# Patient Record
Sex: Male | Born: 1955 | Hispanic: No | Marital: Married | Smoking: Never smoker
Health system: Southern US, Community
[De-identification: ages and names within clinical notes are randomized; demographics above are authoritative.]

## PROBLEM LIST (undated history)

## (undated) DIAGNOSIS — E785 Hyperlipidemia, unspecified: Secondary | ICD-10-CM

## (undated) HISTORY — DX: Hyperlipidemia, unspecified: E78.5

## (undated) HISTORY — PX: SHOULDER SURGERY: SHX246

---

## 2009-02-14 ENCOUNTER — Encounter: Admission: RE | Admit: 2009-02-14 | Discharge: 2009-02-14 | Payer: Self-pay | Admitting: Chiropractic Medicine

## 2009-07-12 ENCOUNTER — Ambulatory Visit (HOSPITAL_BASED_OUTPATIENT_CLINIC_OR_DEPARTMENT_OTHER): Admission: RE | Admit: 2009-07-12 | Discharge: 2009-07-12 | Payer: Self-pay | Admitting: Orthopedic Surgery

## 2010-07-08 LAB — POCT HEMOGLOBIN-HEMACUE: Hemoglobin: 15.6 g/dL (ref 13.0–17.0)

## 2016-02-07 ENCOUNTER — Other Ambulatory Visit: Payer: Self-pay | Admitting: Orthopedic Surgery

## 2016-02-07 DIAGNOSIS — M5416 Radiculopathy, lumbar region: Secondary | ICD-10-CM

## 2016-02-21 ENCOUNTER — Ambulatory Visit
Admission: RE | Admit: 2016-02-21 | Discharge: 2016-02-21 | Disposition: A | Discharge status: Home / Self Care | Payer: 59 | Source: Ambulatory Visit | Attending: Orthopedic Surgery | Admitting: Orthopedic Surgery

## 2016-02-21 DIAGNOSIS — M5416 Radiculopathy, lumbar region: Secondary | ICD-10-CM

## 2018-09-22 ENCOUNTER — Other Ambulatory Visit: Payer: Self-pay | Admitting: *Deleted

## 2018-09-22 DIAGNOSIS — Z20822 Contact with and (suspected) exposure to covid-19: Secondary | ICD-10-CM

## 2018-09-22 NOTE — Addendum Note (Signed)
Addended by: Brigitte Pulse on: 09/22/2018 06:43 PM   Modules accepted: Orders

## 2019-08-08 ENCOUNTER — Ambulatory Visit: Payer: Medicare Other | Admitting: Podiatry

## 2019-08-08 ENCOUNTER — Ambulatory Visit (INDEPENDENT_AMBULATORY_CARE_PROVIDER_SITE_OTHER): Payer: Medicare Other

## 2019-08-08 ENCOUNTER — Other Ambulatory Visit: Payer: Self-pay | Admitting: Podiatry

## 2019-08-08 ENCOUNTER — Other Ambulatory Visit: Payer: Self-pay

## 2019-08-08 ENCOUNTER — Encounter: Payer: Self-pay | Admitting: Podiatry

## 2019-08-08 DIAGNOSIS — M79672 Pain in left foot: Secondary | ICD-10-CM

## 2019-08-08 DIAGNOSIS — M2022 Hallux rigidus, left foot: Secondary | ICD-10-CM

## 2019-08-08 DIAGNOSIS — M79671 Pain in right foot: Secondary | ICD-10-CM

## 2019-08-08 DIAGNOSIS — M2021 Hallux rigidus, right foot: Secondary | ICD-10-CM

## 2019-08-08 NOTE — Progress Notes (Signed)
Subjective:   Patient ID: Marcus Pearson, male   DOB: 64 y.o.   MRN: [id number]   HPI Patient presents with history of problems with the big toe joint of both feet and states that at times they are getting sore and its been going on a few months and he is very active likes to hike.  Also concerned about nail disease and patient does not smoke   Review of Systems  All other systems reviewed and are negative.       Objective:  Physical Exam Vitals and nursing note reviewed.  Constitutional:      Appearance: He is well-developed.  Pulmonary:     Effort: Pulmonary effort is normal.  Musculoskeletal:        General: Normal range of motion.  Skin:    General: Skin is warm.  Neurological:     Mental Status: He is alert.     Neurovascular status intact muscle strength found to be adequate range of motion within normal limits with patient found to have reduced range of motion of the first MPJ of both feet that are moderately painful with bone spur formation and lack of dorsiflexion.  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  I do not recommend surgery for the big toe joint right and left but ultimately it may be necessary and at this point he will wear rigid bottom shoes and monitor.  Nails will not be treated currently and he will just use good basic techniques  X-rays indicate spur formation of the first MPJ bilateral with signs of hallux limitus of a structural nature bilateral

## 2019-12-12 ENCOUNTER — Ambulatory Visit: Payer: Medicare Other | Admitting: Podiatry

## 2019-12-12 ENCOUNTER — Ambulatory Visit (INDEPENDENT_AMBULATORY_CARE_PROVIDER_SITE_OTHER): Payer: Medicare Other

## 2019-12-12 ENCOUNTER — Other Ambulatory Visit: Payer: Self-pay | Admitting: Podiatry

## 2019-12-12 ENCOUNTER — Encounter: Payer: Self-pay | Admitting: Podiatry

## 2019-12-12 ENCOUNTER — Other Ambulatory Visit: Payer: Self-pay

## 2019-12-12 VITALS — Temp 97.3°F

## 2019-12-12 DIAGNOSIS — M778 Other enthesopathies, not elsewhere classified: Secondary | ICD-10-CM

## 2019-12-12 DIAGNOSIS — M779 Enthesopathy, unspecified: Secondary | ICD-10-CM

## 2019-12-12 DIAGNOSIS — M2022 Hallux rigidus, left foot: Secondary | ICD-10-CM

## 2019-12-12 DIAGNOSIS — M2021 Hallux rigidus, right foot: Secondary | ICD-10-CM

## 2019-12-12 DIAGNOSIS — S9032XA Contusion of left foot, initial encounter: Secondary | ICD-10-CM

## 2019-12-12 NOTE — Progress Notes (Signed)
Subjective:   Patient ID: Marcus Pearson, male   DOB: 64 y.o.   MRN: [id number]   HPI Patient was gone for around 3 weeks did a lot of hiking and stated that he noted his second toe left foot really moved up and he does not remember specific injury.  States it has been very sore around the joint   ROS      Objective:  Physical Exam  Neurovascular status intact with significant elevation of the second digit left which appears acute with hallux limitus rigidus deformity bilateral with bone spur formation noted around the joint surface     Assessment:  Probability for flexor plate stretch or dislocation second MPJ left along with hallux limitus rigidus deformity left over right     Plan:  H&P reviewed both conditions.  And x-ray I did go ahead and I discussed the rigid elevation of the second digit left and I discussed that this probably was an acute injury.  At this point working to treat as an acute capsulitis and I did a proximal nerve block of the area 60 mg like Marcaine I did a sterile prep of the left forefoot I then aspirated the second MPJ getting out a small amount of clear fluid and I injected with quarter cc dexamethasone Kenalog applied a thick plantar pad to take pressure off the joint surface and discussed possible straightening of the toe or other surgery which may be necessary in future.  I also discussed the hallux limitus condition and spur formation and the possibility that this may require correction someday  X-rays indicate the second toe has lifted up significantly at the MPJ left with significant hallux limitus with bone spur formation

## 2019-12-26 ENCOUNTER — Other Ambulatory Visit: Payer: Self-pay

## 2019-12-26 ENCOUNTER — Encounter: Payer: Self-pay | Admitting: Podiatry

## 2019-12-26 ENCOUNTER — Ambulatory Visit: Payer: Medicare Other | Admitting: Podiatry

## 2019-12-26 DIAGNOSIS — M2022 Hallux rigidus, left foot: Secondary | ICD-10-CM

## 2019-12-26 DIAGNOSIS — M779 Enthesopathy, unspecified: Secondary | ICD-10-CM | POA: Diagnosis not present

## 2019-12-26 DIAGNOSIS — M2021 Hallux rigidus, right foot: Secondary | ICD-10-CM

## 2019-12-26 NOTE — Progress Notes (Signed)
Subjective:   Patient ID: Marcus Pearson, male   DOB: 64 y.o.   MRN: [id number]   HPI Patient states the area has improved but he still is having discomfort if he is on it a lot and the toes are still quite a bit up in the air and contracted against the joint   ROS      Objective:  Physical Exam  Neurovascular status intact with rigid contracture digits 2 3 left with plantarflexed second metatarsal with inflammation fluid of the metatarsal phalangeal joint     Assessment:  Flexor plate dislocation with a structural change to the digit metatarsal secondary to pressure     Plan:  H&P reviewed condition.  I recommended orthotics with offloading the second metatarsal phalangeal joint left along with thick metatarsal padding and patient will see ped orthotist to have castings done and offloading of the second metatarsal left done.  Today went ahead applied padding advised on topicals oral anti-inflammatories and rigid bottom shoes to try to reduce the process and did explain the possibility for digital fusion along with shortening osteotomy in the future

## 2020-01-09 ENCOUNTER — Other Ambulatory Visit: Payer: Self-pay

## 2020-01-09 ENCOUNTER — Ambulatory Visit (INDEPENDENT_AMBULATORY_CARE_PROVIDER_SITE_OTHER): Payer: Medicare Other | Admitting: Orthotics

## 2020-01-09 DIAGNOSIS — M779 Enthesopathy, unspecified: Secondary | ICD-10-CM

## 2020-01-09 DIAGNOSIS — M778 Other enthesopathies, not elsewhere classified: Secondary | ICD-10-CM

## 2020-01-09 DIAGNOSIS — M2021 Hallux rigidus, right foot: Secondary | ICD-10-CM

## 2020-01-09 DIAGNOSIS — M2022 Hallux rigidus, left foot: Secondary | ICD-10-CM

## 2020-02-08 ENCOUNTER — Other Ambulatory Visit: Payer: Medicare Other | Admitting: Orthotics

## 2020-02-20 ENCOUNTER — Other Ambulatory Visit: Payer: Self-pay

## 2020-02-20 ENCOUNTER — Ambulatory Visit: Payer: Medicare Other | Admitting: Orthotics

## 2020-02-20 DIAGNOSIS — M2021 Hallux rigidus, right foot: Secondary | ICD-10-CM

## 2020-02-20 DIAGNOSIS — M779 Enthesopathy, unspecified: Secondary | ICD-10-CM

## 2020-02-20 DIAGNOSIS — S9032XA Contusion of left foot, initial encounter: Secondary | ICD-10-CM

## 2020-02-20 NOTE — Progress Notes (Signed)
Patient came in today to pick up custom made foot orthotics.  The goals were accomplished and the patient reported no dissatisfaction with said orthotics.  Patient was advised of breakin period and how to report any issues. 

## 2020-08-30 ENCOUNTER — Other Ambulatory Visit: Payer: Self-pay

## 2020-08-30 ENCOUNTER — Ambulatory Visit: Payer: Medicare Other | Admitting: Podiatry

## 2020-08-30 ENCOUNTER — Encounter: Payer: Self-pay | Admitting: Podiatry

## 2020-08-30 DIAGNOSIS — M2042 Other hammer toe(s) (acquired), left foot: Secondary | ICD-10-CM

## 2020-08-30 DIAGNOSIS — M779 Enthesopathy, unspecified: Secondary | ICD-10-CM | POA: Diagnosis not present

## 2020-08-30 DIAGNOSIS — M2021 Hallux rigidus, right foot: Secondary | ICD-10-CM | POA: Diagnosis not present

## 2020-08-30 DIAGNOSIS — M7672 Peroneal tendinitis, left leg: Secondary | ICD-10-CM | POA: Diagnosis not present

## 2020-08-30 DIAGNOSIS — M2022 Hallux rigidus, left foot: Secondary | ICD-10-CM

## 2020-08-30 NOTE — Progress Notes (Signed)
Subjective:   Patient ID: Marcus Pearson, male   DOB: 65 y.o.   MRN: [id number]   HPI Patient presents concerned about pain he is developing on the outside of his foot and also has concerns about the future of his deformity and whether or not surgery will be necessary and also has concerns about discoloration of the right hallux nail    ROS      Objective:  Physical Exam  Neurovascular status was found to be intact muscle strength found to be adequate range of motion adequate.  Patient has a rigid contracture digit to left with discomfort in the second MPJ with fluid around the joint and is noted to have moderate discomfort in the outside of the left at the fifth metatarsal base mild and not intense currently.  He is using pads and orthotics and shoe gear modifications     Assessment:  Flexor plate dislocation second MPJ left with rigid contracture of the toe with minimal discomfort in the joint or pain on top of the toe.  Mild discomfort in the lateral side of the foot and the peroneal tendon insertion no indication muscle strength loss and discoloration right hallux nail stable in the distal one half of the nailbed     Plan:  Patient overall doing the right things with inserts shoe gear modifications padding therapy and I reviewed peroneal tendinitis and will avoid treating this as best as possible now due to minimal symptoms.  I then discussed the continuation of topical medicine as needed anti-inflammatories if he overdoes it shoe gear modifications and do not currently recommend treatment for the nail disease.  Patient will be seen back and is encouraged to come in if any changes were to occur

## 2020-10-02 ENCOUNTER — Ambulatory Visit (INDEPENDENT_AMBULATORY_CARE_PROVIDER_SITE_OTHER): Payer: Medicare Other | Admitting: Family Medicine

## 2020-10-02 ENCOUNTER — Other Ambulatory Visit: Payer: Self-pay

## 2020-10-02 ENCOUNTER — Encounter: Payer: Self-pay | Admitting: Family Medicine

## 2020-10-02 VITALS — BP 130/68 | HR 61 | Temp 97.3°F | Ht 69.0 in | Wt 186.6 lb

## 2020-10-02 DIAGNOSIS — I10 Essential (primary) hypertension: Secondary | ICD-10-CM | POA: Diagnosis not present

## 2020-10-02 DIAGNOSIS — K219 Gastro-esophageal reflux disease without esophagitis: Secondary | ICD-10-CM | POA: Diagnosis not present

## 2020-10-02 DIAGNOSIS — E538 Deficiency of other specified B group vitamins: Secondary | ICD-10-CM | POA: Diagnosis not present

## 2020-10-02 DIAGNOSIS — E7800 Pure hypercholesterolemia, unspecified: Secondary | ICD-10-CM | POA: Diagnosis not present

## 2020-10-02 DIAGNOSIS — Z0000 Encounter for general adult medical examination without abnormal findings: Secondary | ICD-10-CM

## 2020-10-02 LAB — VITAMIN B12: Vitamin B-12: 356 pg/mL (ref 211–911)

## 2020-10-02 MED ORDER — ESOMEPRAZOLE MAGNESIUM 40 MG PO CPDR: 40.0000 mg | DELAYED_RELEASE_CAPSULE | Freq: Every day | ORAL | 3 refills | Status: DC

## 2020-10-02 NOTE — Progress Notes (Signed)
New Patient Office Visit  Subjective:  Patient ID: Marcus Pearson, male    DOB: 03/16/1956  Age: 65 y.o. MRN: [id number]  CC:  Chief Complaint  Patient presents with   Establish Care    NP/establish care, no concerns. Patient not fasting.     HPI Marcus Pearson presents for establishment of care and follow-up of his history of hypertension, elevated ldl cholesterol, GERD, health maintenance and history of dysplastic he was with history of melanoma.  Seeing dermatology regularly for follow-up of dysplastic nevus.  Blood pressure is controlled well with losartan and low-dose amlodipine.  History of significantly elevated LDL cholesterol with favorable HDL treated with 40 of atorvastatin.  History of GERD that has not been controlled on over-the-counter 20 mg of Nexium.  He has never had an EGD.  He is due for his next colonoscopy in 2023.  He has never had a heart attack stroke or known vascular disease.  He is taking a 325 mg aspirin.  He does not smoke or use illicit drugs.  He has 1 serving of alcohol weekly.  He is quite active walking up to 5 miles daily.  He is having problems with hammertoes on his left foot and is currently seeing podiatry.  Past Medical History:  Diagnosis Date   Hyperlipidemia     Past Surgical History:  Procedure Laterality Date   SHOULDER SURGERY Right     Family History  Problem Relation Age of Onset   Hypertension Mother    Parkinson's disease Mother     Social History   Socioeconomic History   Marital status: Married    Spouse name: Not on file   Number of children: Not on file   Years of education: Not on file   Highest education level: Not on file  Occupational History   Not on file  Tobacco Use   Smoking status: Never   Smokeless tobacco: Never  Vaping Use   Vaping Use: Never used  Substance and Sexual Activity   Alcohol use: Yes    Alcohol/week: 1.0 standard drink    Types: 1 Glasses of wine per week    Comment: social   Drug  use: Never   Sexual activity: Yes  Other Topics Concern   Not on file  Social History Narrative   ** Merged History Encounter **       Social Determinants of Health   Financial Resource Strain: Not on file  Food Insecurity: Not on file  Transportation Needs: Not on file  Physical Activity: Not on file  Stress: Not on file  Social Connections: Not on file  Intimate Partner Violence: Not on file    ROS Review of Systems  Constitutional: Negative.   HENT: Negative.    Eyes: Negative.   Respiratory: Negative.    Cardiovascular: Negative.   Gastrointestinal: Negative.   Endocrine: Negative for polyphagia and polyuria.  Genitourinary: Negative.   Musculoskeletal:  Positive for arthralgias.  Skin: Negative.   Allergic/Immunologic: Negative for immunocompromised state.  Neurological:  Negative for weakness and numbness.  Psychiatric/Behavioral: Negative.     Objective:   Today's Vitals: BP 130/68   Pulse 61   Temp (!) 97.3 F (36.3 C) (Temporal)   Ht 5\' 9"  (1.753 m)   Wt 186 lb 9.6 oz (84.6 kg)   SpO2 99%   BMI 27.56 kg/m   Physical Exam Vitals and nursing note reviewed.  Constitutional:      General: He is not in acute distress.  Appearance: Normal appearance. He is normal weight. He is not ill-appearing, toxic-appearing or diaphoretic.  HENT:     Head: Normocephalic and atraumatic.     Right Ear: Tympanic membrane, ear canal and external ear normal.     Left Ear: Tympanic membrane, ear canal and external ear normal.     Mouth/Throat:     Mouth: Mucous membranes are moist.     Pharynx: Oropharynx is clear. No oropharyngeal exudate or posterior oropharyngeal erythema.  Eyes:     General:        Right eye: No discharge.        Left eye: No discharge.     Extraocular Movements: Extraocular movements intact.     Conjunctiva/sclera: Conjunctivae normal.     Pupils: Pupils are equal, round, and reactive to light.  Neck:     Vascular: No carotid bruit.   Cardiovascular:     Rate and Rhythm: Normal rate and regular rhythm.  Pulmonary:     Effort: Pulmonary effort is normal.     Breath sounds: Normal breath sounds.  Musculoskeletal:     Cervical back: Normal range of motion and neck supple. No rigidity or tenderness.  Lymphadenopathy:     Cervical: No cervical adenopathy.  Skin:    General: Skin is warm and dry.  Neurological:     Mental Status: He is alert and oriented to person, place, and time.  Psychiatric:        Behavior: Behavior normal.    Assessment & Plan:   Problem List Items Addressed This Visit   None Visit Diagnoses     Essential hypertension    -  Primary   Relevant Medications   aspirin 325 MG tablet   omega-3 acid ethyl esters (LOVAZA) 1 g capsule   Other Relevant Orders   Basic metabolic panel   CBC   Urinalysis, Routine w reflex microscopic   Healthcare maintenance       Relevant Orders   Hemoglobin A1c   PSA   Elevated LDL cholesterol level       Relevant Orders   Hepatic function panel   Lipid panel   Gastroesophageal reflux disease, unspecified whether esophagitis present       Relevant Medications   esomeprazole (NEXIUM) 40 MG capsule   Other Relevant Orders   Ambulatory referral to Gastroenterology   B12 deficiency       Relevant Orders   Vitamin B12       Outpatient Encounter Medications as of 10/02/2020  Medication Sig   amLODipine (NORVASC) 2.5 MG tablet Take 2.5 mg by mouth every morning.   aspirin 325 MG tablet Take 81 mg by mouth daily.   atorvastatin (LIPITOR) 40 MG tablet SMARTSIG:1 Tablet(s) By Mouth Every Evening   esomeprazole (NEXIUM) 40 MG capsule Take 1 capsule (40 mg total) by mouth daily at 12 noon.   losartan (COZAAR) 100 MG tablet Take 100 mg by mouth daily.   Multiple Vitamin (MULTIVITAMIN) capsule Take 1 capsule by mouth daily.   omega-3 acid ethyl esters (LOVAZA) 1 g capsule Take 1 g by mouth daily.   valACYclovir (VALTREX) 1000 MG tablet Take 1,000 mg by mouth 2  (two) times daily.   [DISCONTINUED] esomeprazole (NEXIUM) 20 MG capsule Take 20 mg by mouth daily at 12 noon.   No facility-administered encounter medications on file as of 10/02/2020.    Follow-up: Return in about 3 months (around 01/02/2021), or Return in 3 months for physical and check up. May stop  aspirin. Increase Nexium to 40mg .Libby Maw, MD

## 2020-10-03 MED ORDER — VITAMIN B-12 1000 MCG PO TABS: 1000.0000 ug | ORAL_TABLET | Freq: Every day | ORAL | 2 refills | Status: AC

## 2020-10-03 NOTE — Addendum Note (Signed)
Addended by: Jon Billings on: 10/03/2020 08:14 AM   Modules accepted: Orders

## 2020-12-24 ENCOUNTER — Other Ambulatory Visit: Payer: Self-pay

## 2020-12-25 ENCOUNTER — Encounter: Payer: Self-pay | Admitting: Family Medicine

## 2020-12-25 ENCOUNTER — Ambulatory Visit (INDEPENDENT_AMBULATORY_CARE_PROVIDER_SITE_OTHER): Payer: Medicare Other | Admitting: Family Medicine

## 2020-12-25 VITALS — BP 130/78 | HR 57 | Temp 97.6°F | Ht 70.0 in | Wt 185.2 lb

## 2020-12-25 DIAGNOSIS — I10 Essential (primary) hypertension: Secondary | ICD-10-CM | POA: Diagnosis not present

## 2020-12-25 DIAGNOSIS — R351 Nocturia: Secondary | ICD-10-CM

## 2020-12-25 DIAGNOSIS — Z0000 Encounter for general adult medical examination without abnormal findings: Secondary | ICD-10-CM

## 2020-12-25 DIAGNOSIS — R252 Cramp and spasm: Secondary | ICD-10-CM

## 2020-12-25 DIAGNOSIS — N401 Enlarged prostate with lower urinary tract symptoms: Secondary | ICD-10-CM

## 2020-12-25 DIAGNOSIS — G4700 Insomnia, unspecified: Secondary | ICD-10-CM

## 2020-12-25 DIAGNOSIS — E538 Deficiency of other specified B group vitamins: Secondary | ICD-10-CM | POA: Diagnosis not present

## 2020-12-25 DIAGNOSIS — E7800 Pure hypercholesterolemia, unspecified: Secondary | ICD-10-CM | POA: Diagnosis not present

## 2020-12-25 DIAGNOSIS — Z23 Encounter for immunization: Secondary | ICD-10-CM | POA: Diagnosis not present

## 2020-12-25 DIAGNOSIS — K219 Gastro-esophageal reflux disease without esophagitis: Secondary | ICD-10-CM | POA: Diagnosis not present

## 2020-12-25 DIAGNOSIS — Z0001 Encounter for general adult medical examination with abnormal findings: Secondary | ICD-10-CM

## 2020-12-25 LAB — MAGNESIUM: Magnesium: 2.1 mg/dL (ref 1.5–2.5)

## 2020-12-25 LAB — VITAMIN B12: Vitamin B-12: 522 pg/mL (ref 211–911)

## 2020-12-25 MED ORDER — TAMSULOSIN HCL 0.4 MG PO CAPS: 0.4000 mg | ORAL_CAPSULE | Freq: Every day | ORAL | 3 refills | Status: DC

## 2020-12-25 NOTE — Progress Notes (Signed)
Established Patient Office Visit  Subjective:  Patient ID: Marcus Pearson, male    DOB: Mar 29, 1956  Age: 65 y.o. MRN: [id number]  CC:  Chief Complaint  Patient presents with   Annual Exam    CPE/labs.  fasting today.  C/o having cramps in legs.    HPI Marcus Pearson presents for a complete physical exam.  He is fasting this morning.  GERD is improved with higher dose of Nexium.  He is having some cramps in his leg.  He experiences nocturia 2-3 times nightly with decreased force of flow.  He is not sleeping well.  Recent PHQ-9 was negative for depression.  Past Medical History:  Diagnosis Date   Hyperlipidemia     Past Surgical History:  Procedure Laterality Date   SHOULDER SURGERY Right     Family History  Problem Relation Age of Onset   Hypertension Mother    Parkinson's disease Mother     Social History   Socioeconomic History   Marital status: Married    Spouse name: Not on file   Number of children: Not on file   Years of education: Not on file   Highest education level: Not on file  Occupational History   Not on file  Tobacco Use   Smoking status: Never   Smokeless tobacco: Never  Vaping Use   Vaping Use: Never used  Substance and Sexual Activity   Alcohol use: Yes    Alcohol/week: 1.0 standard drink    Types: 1 Glasses of wine per week    Comment: social   Drug use: Never   Sexual activity: Yes  Other Topics Concern   Not on file  Social History Narrative   ** Merged History Encounter **       Social Determinants of Health   Financial Resource Strain: Not on file  Food Insecurity: Not on file  Transportation Needs: Not on file  Physical Activity: Not on file  Stress: Not on file  Social Connections: Not on file  Intimate Partner Violence: Not on file    Outpatient Medications Prior to Visit  Medication Sig Dispense Refill   amLODipine (NORVASC) 2.5 MG tablet Take 2.5 mg by mouth every morning.     atorvastatin (LIPITOR) 40 MG tablet  SMARTSIG:1 Tablet(s) By Mouth Every Evening     esomeprazole (NEXIUM) 40 MG capsule Take 1 capsule (40 mg total) by mouth daily at 12 noon. 90 capsule 3   losartan (COZAAR) 100 MG tablet Take 100 mg by mouth daily.     Multiple Vitamin (MULTIVITAMIN) capsule Take 1 capsule by mouth daily.     omega-3 acid ethyl esters (LOVAZA) 1 g capsule Take 1 g by mouth daily.     valACYclovir (VALTREX) 1000 MG tablet Take 1,000 mg by mouth 2 (two) times daily.     vitamin B-12 (CYANOCOBALAMIN) 1000 MCG tablet Take 1 tablet (1,000 mcg total) by mouth daily. 90 tablet 2   aspirin 325 MG tablet Take 81 mg by mouth daily. (Patient not taking: Reported on 12/25/2020)     No facility-administered medications prior to visit.    No Known Allergies  ROS Review of Systems  Constitutional: Negative.   HENT: Negative.    Eyes:  Negative for photophobia and visual disturbance.  Respiratory: Negative.    Cardiovascular: Negative.   Gastrointestinal: Negative.   Endocrine: Negative for polyphagia and polyuria.  Genitourinary:  Positive for difficulty urinating and frequency.  Musculoskeletal:  Positive for myalgias. Negative for gait problem  and joint swelling.  Skin: Negative.   Neurological:  Negative for speech difficulty and weakness.  Psychiatric/Behavioral:  Positive for sleep disturbance.   Depression screen [facility] 2/9 10/02/2020  Decreased Interest 0  Down, Depressed, Hopeless 0  PHQ - 2 Score 0  Altered sleeping 1  Tired, decreased energy 1  Change in appetite 0  Feeling bad or failure about yourself  0  Trouble concentrating 0  Moving slowly or fidgety/restless 0  Suicidal thoughts 0  PHQ-9 Score 2  Difficult doing work/chores Somewhat difficult       Objective:    Physical Exam Vitals and nursing note reviewed.  Constitutional:      General: He is not in acute distress.    Appearance: Normal appearance. He is not ill-appearing, toxic-appearing or diaphoretic.  HENT:     Head:  Normocephalic and atraumatic.     Right Ear: Tympanic membrane, ear canal and external ear normal.     Left Ear: Tympanic membrane, ear canal and external ear normal.     Mouth/Throat:     Mouth: Mucous membranes are moist.     Pharynx: Oropharynx is clear. No oropharyngeal exudate or posterior oropharyngeal erythema.  Eyes:     Extraocular Movements: Extraocular movements intact.     Conjunctiva/sclera: Conjunctivae normal.     Pupils: Pupils are equal, round, and reactive to light.  Neck:     Vascular: No carotid bruit.  Cardiovascular:     Rate and Rhythm: Normal rate and regular rhythm.     Heart sounds: Murmur heard.  Pulmonary:     Effort: Pulmonary effort is normal.     Breath sounds: Normal breath sounds.  Abdominal:     General: Abdomen is flat. Bowel sounds are normal. There is no distension.     Palpations: Abdomen is soft. There is no mass.     Tenderness: There is no abdominal tenderness. There is no guarding or rebound.     Hernia: No hernia is present. There is no hernia in the left inguinal area or right inguinal area.  Genitourinary:    Penis: Normal and uncircumcised. No phimosis, paraphimosis, hypospadias, erythema, tenderness, discharge, swelling or lesions.      Testes:        Right: Mass, tenderness or swelling not present. Right testis is descended.        Left: Mass, tenderness or swelling not present. Left testis is descended.     Epididymis:     Right: Not inflamed or enlarged.     Left: Not inflamed or enlarged.     Prostate: Enlarged. Not tender and no nodules present.     Rectum: Guaiac result negative. No mass, tenderness, anal fissure, external hemorrhoid or internal hemorrhoid. Normal anal tone.  Musculoskeletal:     Cervical back: No rigidity or tenderness.     Right lower leg: No edema.     Left lower leg: No edema.  Lymphadenopathy:     Cervical: No cervical adenopathy.     Lower Body: No right inguinal adenopathy. No left inguinal adenopathy.   Skin:    General: Skin is warm and dry.  Neurological:     Mental Status: He is alert and oriented to person, place, and time.  Psychiatric:        Mood and Affect: Mood normal.        Behavior: Behavior normal.    BP 130/78   Pulse (!) 57   Temp 97.6 F (36.4 C) (Temporal)  Ht _0  (1.778 m)   Wt 185 lb 3.2 oz (84 kg)   SpO2 99%   BMI 26.57 kg/m  Wt Readings from Last 3 Encounters:  12/25/20 185 lb 3.2 oz (84 kg)  10/02/20 186 lb 9.6 oz (84.6 kg)     Health Maintenance Due  Topic Date Due   Hepatitis C Screening  Never done   TETANUS/TDAP  Never done   Zoster Vaccines- Shingrix (1 of 2) Never done   COLONOSCOPY (Pts 45-43yr Insurance coverage will need to be confirmed)  Never done   PNA vac Low Risk Adult (1 of 2 - PCV13) Never done   COVID-19 Vaccine (3 - Pfizer risk series) 06/26/2019   INFLUENZA VACCINE  Never done    There are no preventive care reminders to display for this patient.  No results found for: TSH Lab Results  Component Value Date   HGB 15.6 07/12/2009   No results found for: NA, K, CHLORIDE, CO2, GLUCOSE, BUN, CREATININE, BILITOT, ALKPHOS, AST, ALT, PROT, ALBUMIN, CALCIUM, ANIONGAP, EGFR, GFR No results found for: CHOL No results found for: HDL No results found for: LDLCALC No results found for: TRIG No results found for: CHOLHDL No results found for: HGBA1C    Assessment & Plan:   Problem List Items Addressed This Visit       Cardiovascular and Mediastinum   Essential hypertension - Primary     Digestive   Gastroesophageal reflux disease     Other   Nocturia   Relevant Medications   tamsulosin (FLOMAX) 0.4 MG CAPS capsule   Healthcare maintenance   Relevant Orders   Flu vaccine HIGH DOSE PF (Fluzone High dose)   Elevated LDL cholesterol level   Insomnia   Muscle cramps   Relevant Orders   Magnesium   B12 deficiency   Relevant Orders   Vitamin B12    Meds ordered this encounter  Medications   tamsulosin (FLOMAX)  0.4 MG CAPS capsule    Sig: Take 1 capsule (0.4 mg total) by mouth daily.    Dispense:  30 capsule    Refill:  3    Follow-up: Return in about 3 months (around 03/26/2021).  Given information on health maintenance and disease prevention.  Also given information on BPH and tamsulosin.  Hopefully this will improve his sleep.  Advised him to fluid restrict a couple of hours before bedtime.  Above labs are pending.  Checking magnesium for muscle cramps.  Continue Nexium and vitamin B12.  His AVS  WLibby Maw MD

## 2020-12-27 ENCOUNTER — Other Ambulatory Visit: Payer: Self-pay

## 2020-12-27 ENCOUNTER — Other Ambulatory Visit (INDEPENDENT_AMBULATORY_CARE_PROVIDER_SITE_OTHER): Payer: Medicare Other

## 2020-12-27 DIAGNOSIS — Z0000 Encounter for general adult medical examination without abnormal findings: Secondary | ICD-10-CM | POA: Diagnosis not present

## 2020-12-27 DIAGNOSIS — I10 Essential (primary) hypertension: Secondary | ICD-10-CM

## 2020-12-27 DIAGNOSIS — E7800 Pure hypercholesterolemia, unspecified: Secondary | ICD-10-CM | POA: Diagnosis not present

## 2020-12-27 LAB — LIPID PANEL
Cholesterol: 182 mg/dL (ref 0–200)
HDL: 57.5 mg/dL (ref >39.00)
LDL Cholesterol: 96 mg/dL (ref 0–99)
NonHDL: 124.11
Total CHOL/HDL Ratio: 3
Triglycerides: 142 mg/dL (ref 0.0–149.0)
VLDL: 28.4 mg/dL (ref 0.0–40.0)

## 2020-12-27 LAB — URINALYSIS, ROUTINE W REFLEX MICROSCOPIC
Bilirubin Urine: NEGATIVE
Hgb urine dipstick: NEGATIVE
Ketones, ur: NEGATIVE
Leukocytes,Ua: NEGATIVE
Nitrite: NEGATIVE
RBC / HPF: NONE SEEN (ref 0–?)
Specific Gravity, Urine: 1.015 (ref 1.000–1.030)
Total Protein, Urine: NEGATIVE
Urine Glucose: NEGATIVE
Urobilinogen, UA: 0.2 (ref 0.0–1.0)
pH: 6 (ref 5.0–8.0)

## 2020-12-27 LAB — HEPATIC FUNCTION PANEL
ALT: 32 U/L (ref 0–53)
AST: 25 U/L (ref 0–37)
Albumin: 4.4 g/dL (ref 3.5–5.2)
Alkaline Phosphatase: 67 U/L (ref 39–117)
Bilirubin, Direct: 0.1 mg/dL (ref 0.0–0.3)
Total Bilirubin: 0.6 mg/dL (ref 0.2–1.2)
Total Protein: 7 g/dL (ref 6.0–8.3)

## 2020-12-27 LAB — BASIC METABOLIC PANEL
BUN: 15 mg/dL (ref 6–23)
CO2: 31 mEq/L (ref 19–32)
Calcium: 9.7 mg/dL (ref 8.4–10.5)
Chloride: 102 mEq/L (ref 96–112)
Creatinine, Ser: 0.92 mg/dL (ref 0.40–1.50)
GFR: 86.25 mL/min (ref >60.00)
Glucose, Bld: 90 mg/dL (ref 70–99)
Potassium: 4.1 mEq/L (ref 3.5–5.1)
Sodium: 139 mEq/L (ref 135–145)

## 2020-12-27 LAB — CBC
HCT: 43 % (ref 39.0–52.0)
Hemoglobin: 14.1 g/dL (ref 13.0–17.0)
MCHC: 32.9 g/dL (ref 30.0–36.0)
MCV: 100.2 fl — ABNORMAL HIGH (ref 78.0–100.0)
Platelets: 322 10*3/uL (ref 150.0–400.0)
RBC: 4.29 Mil/uL (ref 4.22–5.81)
RDW: 13.2 % (ref 11.5–15.5)
WBC: 4.8 10*3/uL (ref 4.0–10.5)

## 2020-12-27 LAB — HEMOGLOBIN A1C: Hgb A1c MFr Bld: 5.9 % (ref 4.6–6.5)

## 2020-12-27 LAB — PSA: PSA: 0.73 ng/mL (ref 0.10–4.00)

## 2021-01-04 ENCOUNTER — Telehealth: Payer: Self-pay | Admitting: Family Medicine

## 2021-01-07 NOTE — Telephone Encounter (Signed)
Spoke to patient and he states that he is flying internationally and is already at the airport.  He will just take some Advil PM on the flight.  Dm/cma

## 2021-03-19 ENCOUNTER — Telehealth: Payer: Self-pay | Admitting: Family Medicine

## 2021-03-19 ENCOUNTER — Ambulatory Visit (INDEPENDENT_AMBULATORY_CARE_PROVIDER_SITE_OTHER): Payer: Medicare Other | Admitting: Family Medicine

## 2021-03-19 ENCOUNTER — Encounter: Payer: Self-pay | Admitting: Family Medicine

## 2021-03-19 ENCOUNTER — Other Ambulatory Visit: Payer: Self-pay

## 2021-03-19 ENCOUNTER — Ambulatory Visit (INDEPENDENT_AMBULATORY_CARE_PROVIDER_SITE_OTHER): Payer: Medicare Other

## 2021-03-19 VITALS — BP 132/70 | HR 61 | Temp 97.0°F | Ht 70.0 in | Wt 188.8 lb

## 2021-03-19 DIAGNOSIS — R351 Nocturia: Secondary | ICD-10-CM

## 2021-03-19 DIAGNOSIS — N401 Enlarged prostate with lower urinary tract symptoms: Secondary | ICD-10-CM | POA: Diagnosis not present

## 2021-03-19 DIAGNOSIS — M19041 Primary osteoarthritis, right hand: Secondary | ICD-10-CM | POA: Diagnosis not present

## 2021-03-19 DIAGNOSIS — Z23 Encounter for immunization: Secondary | ICD-10-CM | POA: Diagnosis not present

## 2021-03-19 DIAGNOSIS — Z1211 Encounter for screening for malignant neoplasm of colon: Secondary | ICD-10-CM

## 2021-03-19 DIAGNOSIS — E7800 Pure hypercholesterolemia, unspecified: Secondary | ICD-10-CM

## 2021-03-19 MED ORDER — DICLOFENAC SODIUM 1 % EX GEL: CUTANEOUS | 2 refills | Status: AC

## 2021-03-19 MED ORDER — ATORVASTATIN CALCIUM 40 MG PO TABS: 40.0000 mg | ORAL_TABLET | Freq: Every day | ORAL | 3 refills | Status: DC

## 2021-03-19 NOTE — Telephone Encounter (Signed)
Pt called requesting refill on atorvastatin

## 2021-03-19 NOTE — Progress Notes (Signed)
Established Patient Office Visit  Subjective:  Patient ID: Marcus Pearson, male    DOB: 25-Dec-1955  Age: 65 y.o. MRN: [id number]  CC:  Chief Complaint  Patient presents with   Follow-up    3 month follow up, concerns about possible arthritis in right hand.     HPI Marcus Pearson presents for follow-up of BPH associated with insomnia, pain in right hand and health maintenance issues.  Due for colonoscopy.  Has only had 1 pneumonia vaccine.  Ongoing problems around the thumb of his right hand.  He is right-hand dominant.  He is an avid right-handed golfer.  No specific injury or history of trauma.  Urine flow is improved with Flomax.  He is tolerating the medication well.  Sleep is better without as much nocturia.  Past Medical History:  Diagnosis Date   Hyperlipidemia     Past Surgical History:  Procedure Laterality Date   SHOULDER SURGERY Right     Family History  Problem Relation Age of Onset   Hypertension Mother    Parkinson's disease Mother     Social History   Socioeconomic History   Marital status: Married    Spouse name: Not on file   Number of children: Not on file   Years of education: Not on file   Highest education level: Not on file  Occupational History   Not on file  Tobacco Use   Smoking status: Never   Smokeless tobacco: Never  Vaping Use   Vaping Use: Never used  Substance and Sexual Activity   Alcohol use: Yes    Alcohol/week: 1.0 standard drink    Types: 1 Glasses of wine per week    Comment: social   Drug use: Never   Sexual activity: Yes  Other Topics Concern   Not on file  Social History Narrative   ** Merged History Encounter **       Social Determinants of Health   Financial Resource Strain: Not on file  Food Insecurity: Not on file  Transportation Needs: Not on file  Physical Activity: Not on file  Stress: Not on file  Social Connections: Not on file  Intimate Partner Violence: Not on file    Outpatient Medications  Prior to Visit  Medication Sig Dispense Refill   atorvastatin (LIPITOR) 40 MG tablet SMARTSIG:1 Tablet(s) By Mouth Every Evening     esomeprazole (NEXIUM) 40 MG capsule Take 1 capsule (40 mg total) by mouth daily at 12 noon. 90 capsule 3   losartan (COZAAR) 100 MG tablet Take 100 mg by mouth daily.     Multiple Vitamin (MULTIVITAMIN) capsule Take 1 capsule by mouth daily.     omega-3 acid ethyl esters (LOVAZA) 1 g capsule Take 1 g by mouth daily.     tamsulosin (FLOMAX) 0.4 MG CAPS capsule Take 1 capsule (0.4 mg total) by mouth daily. 30 capsule 3   valACYclovir (VALTREX) 1000 MG tablet Take 1,000 mg by mouth 2 (two) times daily.     vitamin B-12 (CYANOCOBALAMIN) 1000 MCG tablet Take 1 tablet (1,000 mcg total) by mouth daily. 90 tablet 2   amLODipine (NORVASC) 2.5 MG tablet Take 2.5 mg by mouth every morning. (Patient not taking: Reported on 03/19/2021)     aspirin 325 MG tablet Take 81 mg by mouth daily. (Patient not taking: Reported on 12/25/2020)     No facility-administered medications prior to visit.    No Known Allergies  ROS Review of Systems  Constitutional:  Negative for chills,  diaphoresis, fatigue, fever and unexpected weight change.  HENT: Negative.    Eyes:  Negative for photophobia and visual disturbance.  Respiratory:  Negative for apnea, shortness of breath and wheezing.   Cardiovascular: Negative.   Gastrointestinal: Negative.   Genitourinary:  Negative for difficulty urinating, frequency and urgency.  Musculoskeletal:  Positive for arthralgias.  Neurological:  Negative for speech difficulty, weakness and numbness.  Psychiatric/Behavioral: Negative.       Objective:    Physical Exam Vitals and nursing note reviewed.  Constitutional:      General: He is not in acute distress.    Appearance: Normal appearance. He is normal weight. He is not ill-appearing, toxic-appearing or diaphoretic.  HENT:     Head: Normocephalic and atraumatic.     Right Ear: External ear  normal.     Left Ear: External ear normal.  Eyes:     General:        Right eye: No discharge.        Left eye: No discharge.     Extraocular Movements: Extraocular movements intact.     Conjunctiva/sclera: Conjunctivae normal.  Pulmonary:     Effort: Pulmonary effort is normal.  Musculoskeletal:     Left hand: Tenderness and bony tenderness present. Normal range of motion. Normal strength. Normal pulse.       Arms:  Skin:    General: Skin is warm and dry.  Neurological:     Mental Status: He is alert.  Psychiatric:        Mood and Affect: Mood normal.        Behavior: Behavior normal.    BP 132/70 (BP Location: Right Arm, Patient Position: Sitting, Cuff Size: Normal)   Pulse 61   Temp (!) 97 F (36.1 C) (Temporal)   Ht 5\' 10"  (1.778 m)   Wt 188 lb 12.8 oz (85.6 kg)   SpO2 98%   BMI 27.09 kg/m  Wt Readings from Last 3 Encounters:  03/19/21 188 lb 12.8 oz (85.6 kg)  12/25/20 185 lb 3.2 oz (84 kg)  10/02/20 186 lb 9.6 oz (84.6 kg)     Health Maintenance Due  Topic Date Due   Hepatitis C Screening  Never done   TETANUS/TDAP  Never done   COLONOSCOPY (Pts 45-18yrs Insurance coverage will need to be confirmed)  Never done    There are no preventive care reminders to display for this patient.  No results found for: TSH Lab Results  Component Value Date   WBC 4.8 12/27/2020   HGB 14.1 12/27/2020   HCT 43.0 12/27/2020   MCV 100.2 (H) 12/27/2020   PLT 322.0 12/27/2020   Lab Results  Component Value Date   NA 139 12/27/2020   K 4.1 12/27/2020   CO2 31 12/27/2020   GLUCOSE 90 12/27/2020   BUN 15 12/27/2020   CREATININE 0.92 12/27/2020   BILITOT 0.6 12/27/2020   ALKPHOS 67 12/27/2020   AST 25 12/27/2020   ALT 32 12/27/2020   PROT 7.0 12/27/2020   ALBUMIN 4.4 12/27/2020   CALCIUM 9.7 12/27/2020   GFR 86.25 12/27/2020   Lab Results  Component Value Date   CHOL 182 12/27/2020   Lab Results  Component Value Date   HDL 57.50 12/27/2020   Lab Results   Component Value Date   LDLCALC 96 12/27/2020   Lab Results  Component Value Date   TRIG 142.0 12/27/2020   Lab Results  Component Value Date   CHOLHDL 3 12/27/2020   Lab  Results  Component Value Date   HGBA1C 5.9 12/27/2020      Assessment & Plan:   Problem List Items Addressed This Visit   None Visit Diagnoses     Benign prostatic hyperplasia with nocturia    -  Primary   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  As needed.    Dispense:  150 g    Refill:  2    Follow-up: Return in about 6 months (around 09/17/2021), or if symptoms worsen or fail to improve.   Continues tamsulosin.  Pneumococcal 20 vaccine today.  He will use Voltaren gel 3-4 times daily on the MCP as needed.  Discussed further options such as physical therapy surgery etc. Libby Maw, MD

## 2021-03-19 NOTE — Telephone Encounter (Signed)
Rx sent in

## 2021-04-28 ENCOUNTER — Other Ambulatory Visit: Payer: Self-pay | Admitting: Family Medicine

## 2021-04-28 DIAGNOSIS — R351 Nocturia: Secondary | ICD-10-CM

## 2021-04-28 DIAGNOSIS — N401 Enlarged prostate with lower urinary tract symptoms: Secondary | ICD-10-CM

## 2021-09-19 DIAGNOSIS — K5730 Diverticulosis of large intestine without perforation or abscess without bleeding: Secondary | ICD-10-CM | POA: Diagnosis not present

## 2021-09-19 DIAGNOSIS — D122 Benign neoplasm of ascending colon: Secondary | ICD-10-CM | POA: Diagnosis not present

## 2021-09-19 DIAGNOSIS — D120 Benign neoplasm of cecum: Secondary | ICD-10-CM | POA: Diagnosis not present

## 2021-09-19 DIAGNOSIS — D123 Benign neoplasm of transverse colon: Secondary | ICD-10-CM | POA: Diagnosis not present

## 2021-09-19 DIAGNOSIS — K648 Other hemorrhoids: Secondary | ICD-10-CM | POA: Diagnosis not present

## 2021-09-19 DIAGNOSIS — Z09 Encounter for follow-up examination after completed treatment for conditions other than malignant neoplasm: Secondary | ICD-10-CM | POA: Diagnosis not present

## 2021-09-19 DIAGNOSIS — Z86010 Personal history of colonic polyps: Secondary | ICD-10-CM | POA: Diagnosis not present

## 2021-09-20 LAB — HM COLONOSCOPY

## 2021-09-23 ENCOUNTER — Ambulatory Visit: Payer: Medicare Other | Admitting: Family Medicine

## 2021-09-24 ENCOUNTER — Other Ambulatory Visit: Payer: Self-pay | Admitting: Family Medicine

## 2021-09-24 DIAGNOSIS — L821 Other seborrheic keratosis: Secondary | ICD-10-CM | POA: Diagnosis not present

## 2021-09-24 DIAGNOSIS — K219 Gastro-esophageal reflux disease without esophagitis: Secondary | ICD-10-CM

## 2021-09-24 DIAGNOSIS — L578 Other skin changes due to chronic exposure to nonionizing radiation: Secondary | ICD-10-CM | POA: Diagnosis not present

## 2021-09-24 DIAGNOSIS — Z86006 Personal history of melanoma in-situ: Secondary | ICD-10-CM | POA: Diagnosis not present

## 2021-09-24 DIAGNOSIS — L814 Other melanin hyperpigmentation: Secondary | ICD-10-CM | POA: Diagnosis not present

## 2021-09-24 DIAGNOSIS — D2271 Melanocytic nevi of right lower limb, including hip: Secondary | ICD-10-CM | POA: Diagnosis not present

## 2021-10-04 DIAGNOSIS — D122 Benign neoplasm of ascending colon: Secondary | ICD-10-CM | POA: Diagnosis not present

## 2021-10-04 DIAGNOSIS — D123 Benign neoplasm of transverse colon: Secondary | ICD-10-CM | POA: Diagnosis not present

## 2021-10-04 DIAGNOSIS — D120 Benign neoplasm of cecum: Secondary | ICD-10-CM | POA: Diagnosis not present

## 2021-11-25 ENCOUNTER — Ambulatory Visit: Payer: Medicare Other | Admitting: Family Medicine

## 2021-12-13 DIAGNOSIS — E785 Hyperlipidemia, unspecified: Secondary | ICD-10-CM | POA: Diagnosis not present

## 2021-12-13 DIAGNOSIS — I10 Essential (primary) hypertension: Secondary | ICD-10-CM | POA: Diagnosis not present

## 2021-12-13 DIAGNOSIS — R7303 Prediabetes: Secondary | ICD-10-CM | POA: Diagnosis not present

## 2021-12-13 DIAGNOSIS — E559 Vitamin D deficiency, unspecified: Secondary | ICD-10-CM | POA: Diagnosis not present

## 2022-01-01 DIAGNOSIS — Z23 Encounter for immunization: Secondary | ICD-10-CM | POA: Diagnosis not present

## 2022-01-09 ENCOUNTER — Telehealth: Payer: Self-pay | Admitting: Family Medicine

## 2022-01-09 NOTE — Telephone Encounter (Signed)
Left message for patient to call back and schedule Medicare Annual Wellness Visit (AWV).   Please offer to do virtually or by telephone.  Left office number and my jabber [phone].  AWVI eligible as of 09/13/2019   Please schedule at anytime with Nurse Health Advisor.

## 2022-02-28 DIAGNOSIS — Z961 Presence of intraocular lens: Secondary | ICD-10-CM | POA: Diagnosis not present

## 2022-02-28 DIAGNOSIS — H524 Presbyopia: Secondary | ICD-10-CM | POA: Diagnosis not present

## 2022-03-04 DIAGNOSIS — L814 Other melanin hyperpigmentation: Secondary | ICD-10-CM | POA: Diagnosis not present

## 2022-03-04 DIAGNOSIS — Z86006 Personal history of melanoma in-situ: Secondary | ICD-10-CM | POA: Diagnosis not present

## 2022-03-04 DIAGNOSIS — L821 Other seborrheic keratosis: Secondary | ICD-10-CM | POA: Diagnosis not present

## 2022-03-04 DIAGNOSIS — D2271 Melanocytic nevi of right lower limb, including hip: Secondary | ICD-10-CM | POA: Diagnosis not present

## 2022-03-04 DIAGNOSIS — D225 Melanocytic nevi of trunk: Secondary | ICD-10-CM | POA: Diagnosis not present

## 2022-03-04 DIAGNOSIS — L578 Other skin changes due to chronic exposure to nonionizing radiation: Secondary | ICD-10-CM | POA: Diagnosis not present

## 2022-03-09 ENCOUNTER — Other Ambulatory Visit: Payer: Self-pay | Admitting: Family Medicine

## 2022-03-09 DIAGNOSIS — E7800 Pure hypercholesterolemia, unspecified: Secondary | ICD-10-CM

## 2022-03-26 DIAGNOSIS — E559 Vitamin D deficiency, unspecified: Secondary | ICD-10-CM | POA: Diagnosis not present

## 2022-03-26 DIAGNOSIS — R252 Cramp and spasm: Secondary | ICD-10-CM | POA: Diagnosis not present

## 2022-03-26 DIAGNOSIS — Z0000 Encounter for general adult medical examination without abnormal findings: Secondary | ICD-10-CM | POA: Diagnosis not present

## 2022-03-26 DIAGNOSIS — R197 Diarrhea, unspecified: Secondary | ICD-10-CM | POA: Diagnosis not present

## 2022-03-26 DIAGNOSIS — I10 Essential (primary) hypertension: Secondary | ICD-10-CM | POA: Diagnosis not present

## 2022-05-06 ENCOUNTER — Other Ambulatory Visit: Payer: Self-pay | Admitting: Family Medicine

## 2022-05-06 DIAGNOSIS — N401 Enlarged prostate with lower urinary tract symptoms: Secondary | ICD-10-CM

## 2022-06-08 ENCOUNTER — Other Ambulatory Visit: Payer: Self-pay | Admitting: Family Medicine

## 2022-06-08 DIAGNOSIS — E7800 Pure hypercholesterolemia, unspecified: Secondary | ICD-10-CM

## 2022-06-12 ENCOUNTER — Telehealth: Payer: Self-pay | Admitting: Podiatry

## 2022-06-12 NOTE — Telephone Encounter (Signed)
Patient called this morning he would like to order 2 pairs of orthotics based off of his 2021 molds.  Patient is aware of the price for the orthotics I quoted $490 for first pair and 2nd pair would be $245.   He will pay for them when they come in .  Is it ok to proceed with ordering them ?

## 2022-06-16 NOTE — Telephone Encounter (Signed)
yes

## 2022-07-31 ENCOUNTER — Other Ambulatory Visit: Payer: Self-pay | Admitting: Family Medicine

## 2022-07-31 DIAGNOSIS — N401 Enlarged prostate with lower urinary tract symptoms: Secondary | ICD-10-CM

## 2022-08-04 DIAGNOSIS — D225 Melanocytic nevi of trunk: Secondary | ICD-10-CM | POA: Diagnosis not present

## 2022-08-04 DIAGNOSIS — D2271 Melanocytic nevi of right lower limb, including hip: Secondary | ICD-10-CM | POA: Diagnosis not present

## 2022-08-04 DIAGNOSIS — W57XXXA Bitten or stung by nonvenomous insect and other nonvenomous arthropods, initial encounter: Secondary | ICD-10-CM | POA: Diagnosis not present

## 2022-08-04 DIAGNOSIS — Z86018 Personal history of other benign neoplasm: Secondary | ICD-10-CM | POA: Diagnosis not present

## 2022-08-04 DIAGNOSIS — S80861A Insect bite (nonvenomous), right lower leg, initial encounter: Secondary | ICD-10-CM | POA: Diagnosis not present

## 2022-08-04 DIAGNOSIS — L578 Other skin changes due to chronic exposure to nonionizing radiation: Secondary | ICD-10-CM | POA: Diagnosis not present

## 2022-08-04 DIAGNOSIS — Z86006 Personal history of melanoma in-situ: Secondary | ICD-10-CM | POA: Diagnosis not present

## 2022-08-04 DIAGNOSIS — L821 Other seborrheic keratosis: Secondary | ICD-10-CM | POA: Diagnosis not present

## 2022-08-04 DIAGNOSIS — L814 Other melanin hyperpigmentation: Secondary | ICD-10-CM | POA: Diagnosis not present

## 2022-08-23 IMAGING — DX DG HAND COMPLETE 3+V*R*
3 series · 3 of 3 positions shown · non-contrast
Comparison: None.

CLINICAL DATA: First MCP pain, initial encounter

EXAM:
RIGHT HAND - COMPLETE 3+ VIEW

[hand pa]
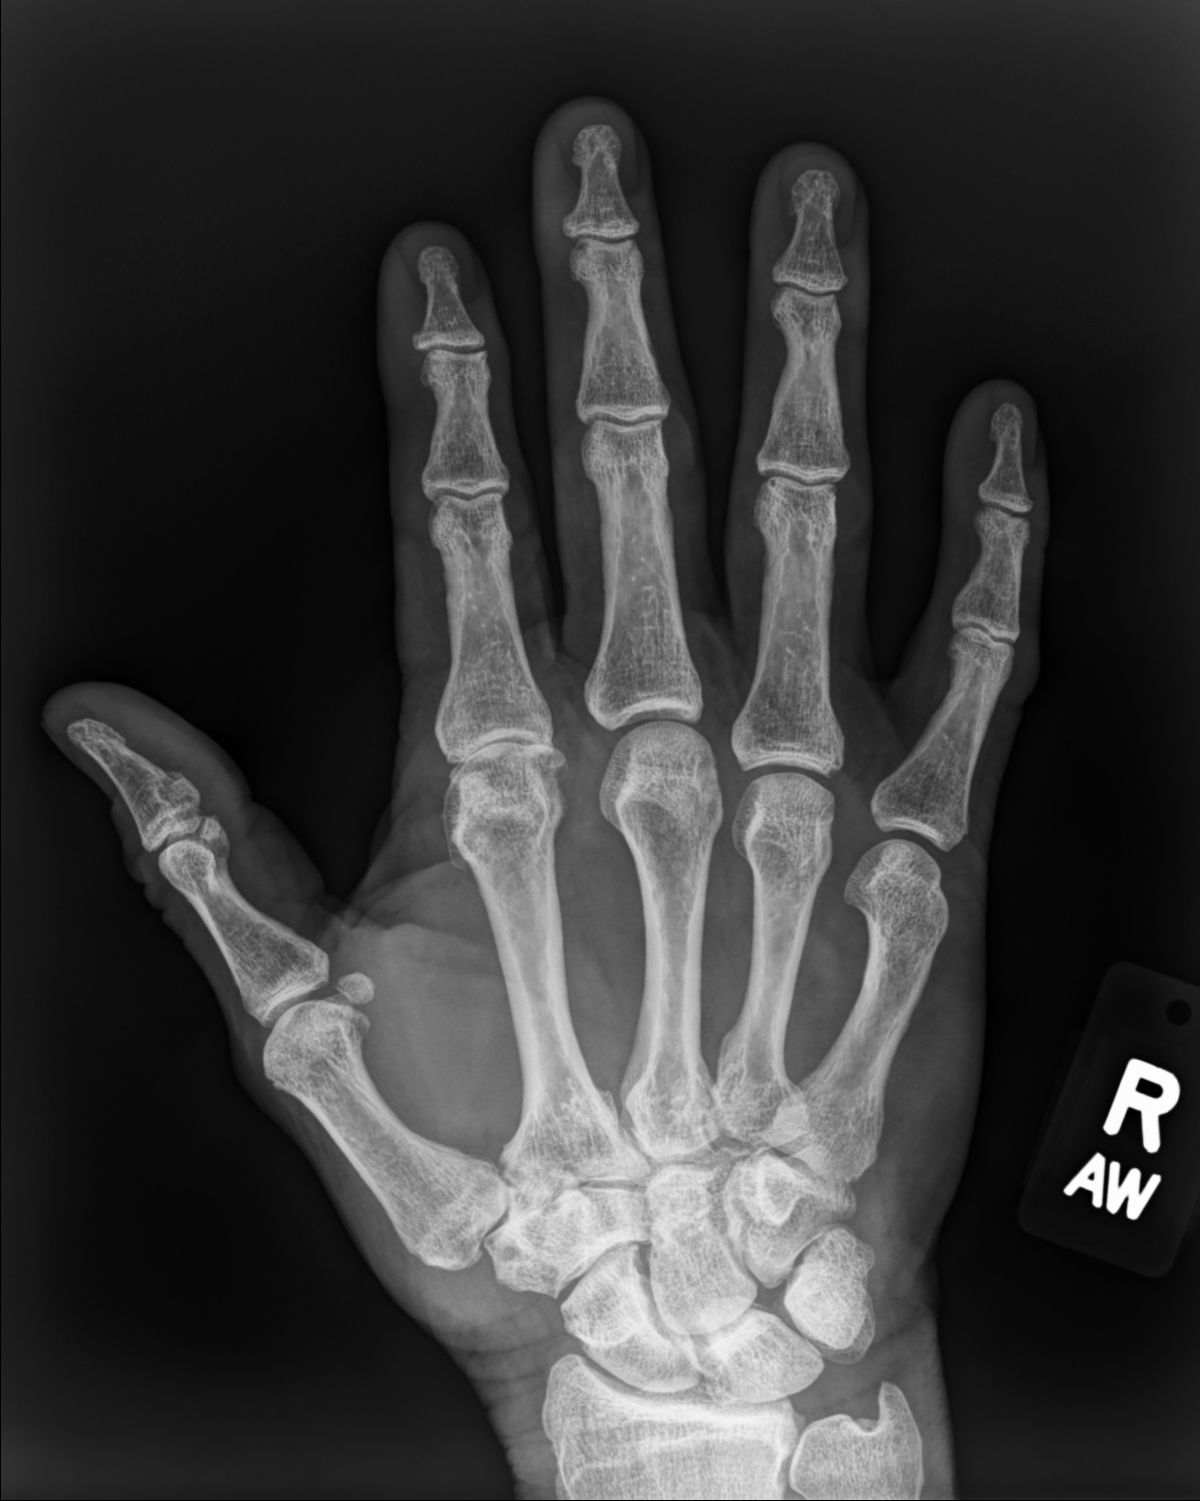

[hand mlo]
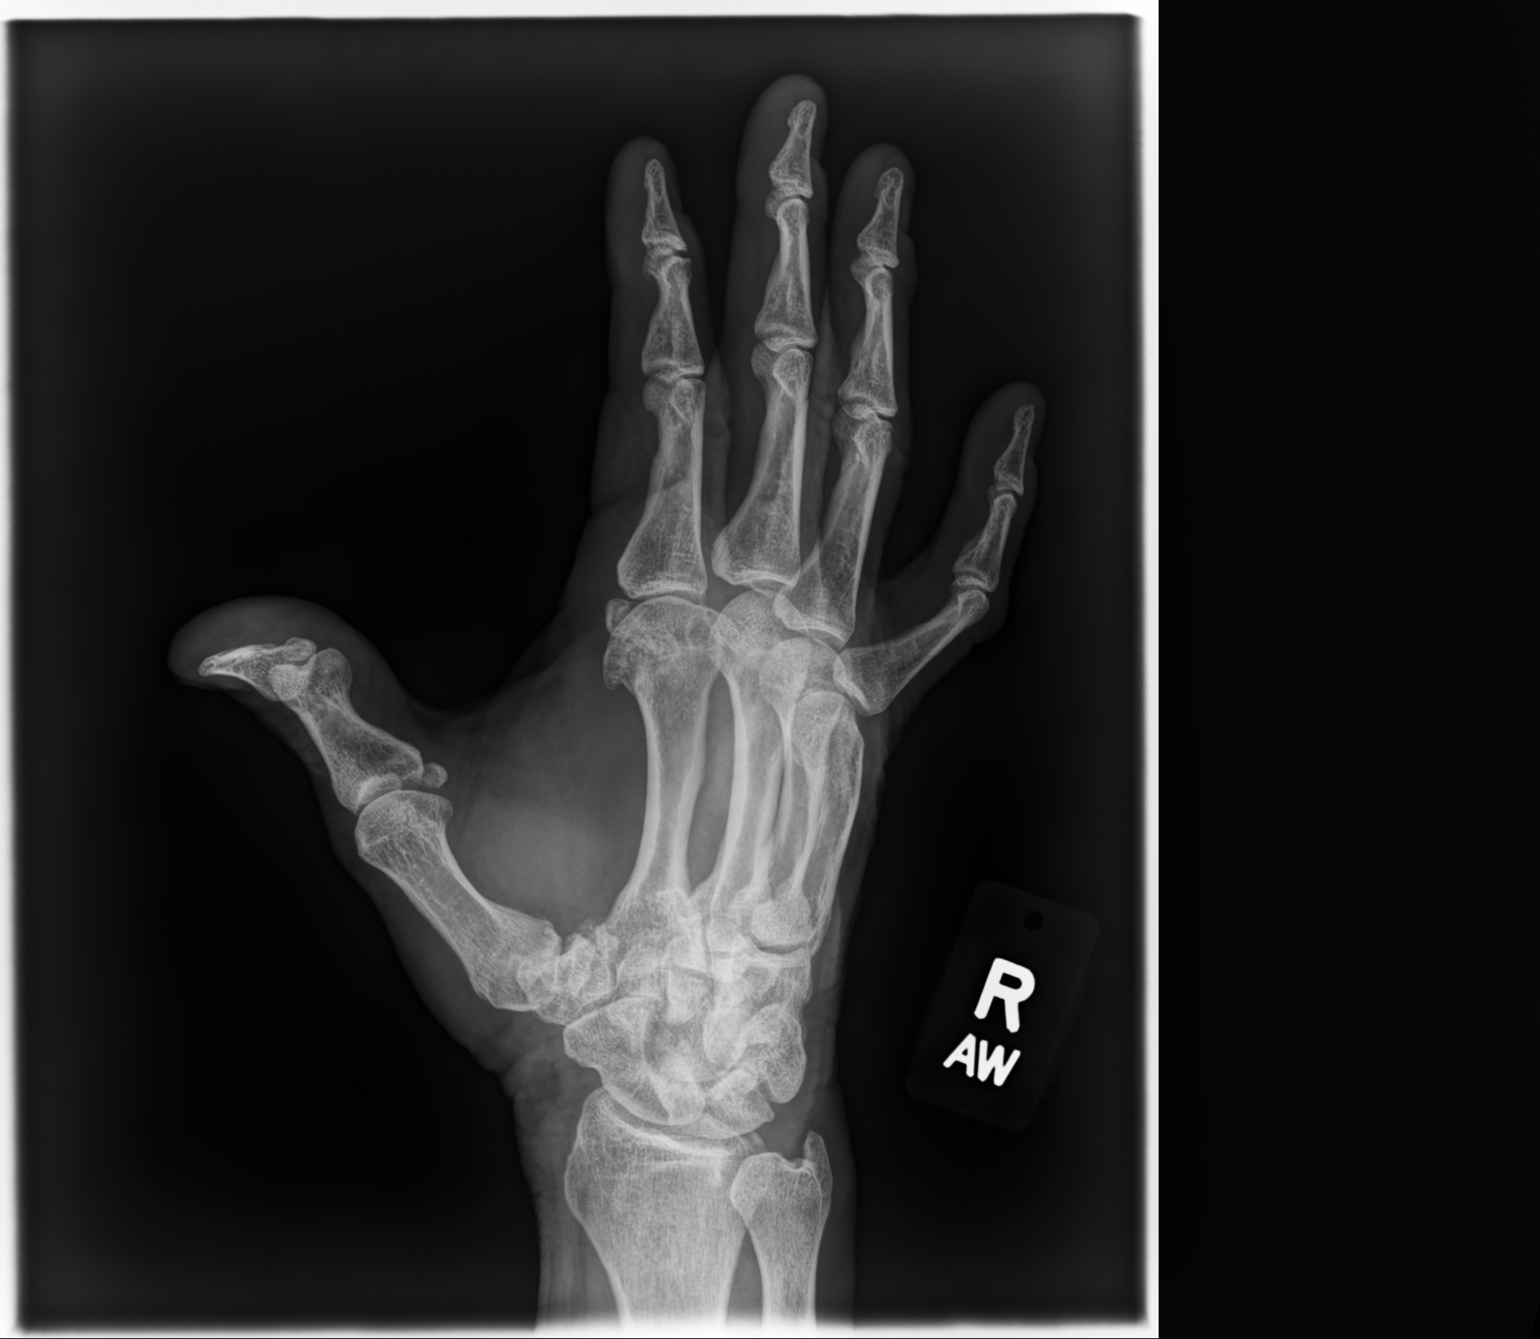

[hand lat]
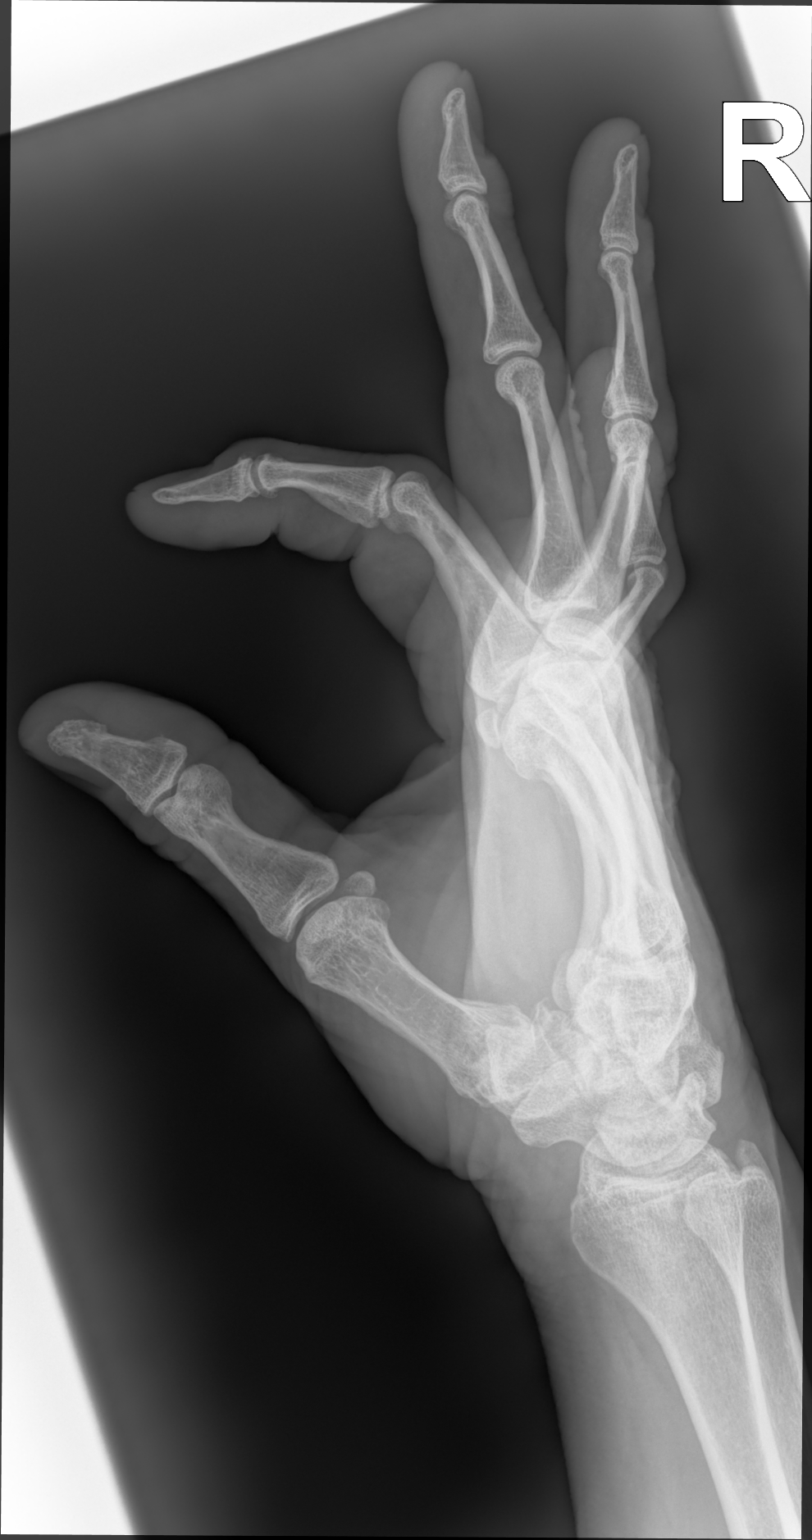

[3 of 3 positions shown; findings below may reference images not displayed]

FINDINGS: Healed fifth metacarpal fracture is seen. Degenerative changes are
noted in the second metacarpal phalangeal joint. No acute fracture
or dislocation is seen. No soft tissue abnormality is noted.
IMPRESSION: Degenerative change without acute abnormality.

## 2022-09-26 ENCOUNTER — Other Ambulatory Visit: Payer: Self-pay | Admitting: Family Medicine

## 2022-09-26 DIAGNOSIS — E7800 Pure hypercholesterolemia, unspecified: Secondary | ICD-10-CM

## 2022-10-26 ENCOUNTER — Other Ambulatory Visit: Payer: Self-pay | Admitting: Family Medicine

## 2022-10-26 DIAGNOSIS — K219 Gastro-esophageal reflux disease without esophagitis: Secondary | ICD-10-CM

## 2022-10-29 ENCOUNTER — Other Ambulatory Visit: Payer: Self-pay | Admitting: Family Medicine

## 2022-10-29 DIAGNOSIS — N401 Enlarged prostate with lower urinary tract symptoms: Secondary | ICD-10-CM

## 2022-11-14 DIAGNOSIS — H43812 Vitreous degeneration, left eye: Secondary | ICD-10-CM | POA: Diagnosis not present

## 2022-11-14 DIAGNOSIS — Z961 Presence of intraocular lens: Secondary | ICD-10-CM | POA: Diagnosis not present

## 2022-11-24 ENCOUNTER — Encounter: Payer: Self-pay | Admitting: Podiatry

## 2022-11-24 ENCOUNTER — Ambulatory Visit: Payer: Medicare Other | Admitting: Podiatry

## 2022-11-24 DIAGNOSIS — M2042 Other hammer toe(s) (acquired), left foot: Secondary | ICD-10-CM

## 2022-11-24 DIAGNOSIS — M7752 Other enthesopathy of left foot: Secondary | ICD-10-CM

## 2022-11-24 DIAGNOSIS — M7751 Other enthesopathy of right foot: Secondary | ICD-10-CM

## 2022-11-24 NOTE — Progress Notes (Signed)
Subjective:   Patient ID: Marcus Pearson, male   DOB: 67 y.o.   MRN: [id number]   HPI Patient states he needs new orthotics but is been several years and they are no longer providing the same support for reduction of pressure   ROS      Objective:  Physical Exam  Neurovascular status intact with rigid contracture digit to left over right pressure against the metatarsal moderate flatfoot deformity bilateral     Assessment:  Inflammatory capsulitis structural bunion creating plantar pressure lesser MPJ left over right     Plan:  H&P reviewed I have recommended orthotics and patient is casted for functional orthotic devices by pedorthist to offload the second metatarsal and will probably have the existing pair recovered

## 2022-12-05 DIAGNOSIS — R7303 Prediabetes: Secondary | ICD-10-CM | POA: Diagnosis not present

## 2022-12-05 DIAGNOSIS — E559 Vitamin D deficiency, unspecified: Secondary | ICD-10-CM | POA: Diagnosis not present

## 2022-12-05 DIAGNOSIS — Z7184 Encounter for health counseling related to travel: Secondary | ICD-10-CM | POA: Diagnosis not present

## 2022-12-05 DIAGNOSIS — E785 Hyperlipidemia, unspecified: Secondary | ICD-10-CM | POA: Diagnosis not present

## 2022-12-05 DIAGNOSIS — Z1159 Encounter for screening for other viral diseases: Secondary | ICD-10-CM | POA: Diagnosis not present

## 2022-12-05 DIAGNOSIS — I10 Essential (primary) hypertension: Secondary | ICD-10-CM | POA: Diagnosis not present

## 2022-12-06 ENCOUNTER — Other Ambulatory Visit: Payer: Self-pay | Admitting: Family Medicine

## 2022-12-06 DIAGNOSIS — K219 Gastro-esophageal reflux disease without esophagitis: Secondary | ICD-10-CM

## 2022-12-23 DIAGNOSIS — Z23 Encounter for immunization: Secondary | ICD-10-CM | POA: Diagnosis not present

## 2022-12-23 DIAGNOSIS — Z7184 Encounter for health counseling related to travel: Secondary | ICD-10-CM | POA: Diagnosis not present

## 2022-12-31 ENCOUNTER — Ambulatory Visit: Payer: Medicare Other | Admitting: Podiatry

## 2022-12-31 ENCOUNTER — Ambulatory Visit (INDEPENDENT_AMBULATORY_CARE_PROVIDER_SITE_OTHER): Payer: Medicare Other

## 2022-12-31 ENCOUNTER — Encounter: Payer: Self-pay | Admitting: Podiatry

## 2022-12-31 DIAGNOSIS — M7751 Other enthesopathy of right foot: Secondary | ICD-10-CM

## 2022-12-31 MED ORDER — TRIAMCINOLONE ACETONIDE 10 MG/ML IJ SUSP
10.0000 mg | Freq: Once | INTRAMUSCULAR | Status: AC
Start: 2022-12-31 — End: 2022-12-31
  Administered 2022-12-31: 10 mg via INTRA_ARTICULAR

## 2022-12-31 NOTE — Progress Notes (Signed)
Subjective:   Patient ID: Marcus Pearson, male   DOB: 67 y.o.   MRN: [id number]   HPI Patient presents stating he has been developing sharp pain in his big toe joint right foot and is getting ready to go to [place]    ROS      Objective:  Physical Exam  Neurovascular status intact inflammation pain first MPJ right fluid buildup around the joint surface reduced range of motion     Assessment:  Probability for arthritis of the first MPJ right fluid buildup around the joint     Plan:  H&P x-ray right taken sterile prep injected the right first MPJ periarticular 3 mg Kenalog 5 mg Xylocaine advised on rigid bottom shoes anti-inflammatories reappoint as needed  X-rays indicate spurring of the first metatarsal head right narrowing of the joint surface no other pathology noted

## 2023-02-10 DIAGNOSIS — Z86006 Personal history of melanoma in-situ: Secondary | ICD-10-CM | POA: Diagnosis not present

## 2023-02-10 DIAGNOSIS — D2271 Melanocytic nevi of right lower limb, including hip: Secondary | ICD-10-CM | POA: Diagnosis not present

## 2023-02-10 DIAGNOSIS — L821 Other seborrheic keratosis: Secondary | ICD-10-CM | POA: Diagnosis not present

## 2023-02-10 DIAGNOSIS — D225 Melanocytic nevi of trunk: Secondary | ICD-10-CM | POA: Diagnosis not present

## 2023-02-10 DIAGNOSIS — L814 Other melanin hyperpigmentation: Secondary | ICD-10-CM | POA: Diagnosis not present

## 2023-02-10 DIAGNOSIS — L578 Other skin changes due to chronic exposure to nonionizing radiation: Secondary | ICD-10-CM | POA: Diagnosis not present

## 2023-02-10 DIAGNOSIS — Z86018 Personal history of other benign neoplasm: Secondary | ICD-10-CM | POA: Diagnosis not present

## 2023-03-18 DIAGNOSIS — H26493 Other secondary cataract, bilateral: Secondary | ICD-10-CM | POA: Diagnosis not present

## 2023-03-18 DIAGNOSIS — Z961 Presence of intraocular lens: Secondary | ICD-10-CM | POA: Diagnosis not present

## 2023-03-18 DIAGNOSIS — H524 Presbyopia: Secondary | ICD-10-CM | POA: Diagnosis not present

## 2023-03-18 DIAGNOSIS — H52203 Unspecified astigmatism, bilateral: Secondary | ICD-10-CM | POA: Diagnosis not present

## 2023-03-26 DIAGNOSIS — I10 Essential (primary) hypertension: Secondary | ICD-10-CM | POA: Diagnosis not present

## 2023-03-26 DIAGNOSIS — E785 Hyperlipidemia, unspecified: Secondary | ICD-10-CM | POA: Diagnosis not present

## 2023-03-26 DIAGNOSIS — E559 Vitamin D deficiency, unspecified: Secondary | ICD-10-CM | POA: Diagnosis not present

## 2023-04-02 DIAGNOSIS — E559 Vitamin D deficiency, unspecified: Secondary | ICD-10-CM | POA: Diagnosis not present

## 2023-04-02 DIAGNOSIS — Z23 Encounter for immunization: Secondary | ICD-10-CM | POA: Diagnosis not present

## 2023-04-02 DIAGNOSIS — Z0000 Encounter for general adult medical examination without abnormal findings: Secondary | ICD-10-CM | POA: Diagnosis not present

## 2023-04-02 DIAGNOSIS — I739 Peripheral vascular disease, unspecified: Secondary | ICD-10-CM | POA: Diagnosis not present

## 2023-04-02 DIAGNOSIS — E785 Hyperlipidemia, unspecified: Secondary | ICD-10-CM | POA: Diagnosis not present

## 2023-04-02 DIAGNOSIS — I10 Essential (primary) hypertension: Secondary | ICD-10-CM | POA: Diagnosis not present

## 2023-08-03 DIAGNOSIS — Z86018 Personal history of other benign neoplasm: Secondary | ICD-10-CM | POA: Diagnosis not present

## 2023-08-03 DIAGNOSIS — D225 Melanocytic nevi of trunk: Secondary | ICD-10-CM | POA: Diagnosis not present

## 2023-08-03 DIAGNOSIS — D2271 Melanocytic nevi of right lower limb, including hip: Secondary | ICD-10-CM | POA: Diagnosis not present

## 2023-08-03 DIAGNOSIS — L578 Other skin changes due to chronic exposure to nonionizing radiation: Secondary | ICD-10-CM | POA: Diagnosis not present

## 2023-08-03 DIAGNOSIS — L570 Actinic keratosis: Secondary | ICD-10-CM | POA: Diagnosis not present

## 2023-08-03 DIAGNOSIS — L814 Other melanin hyperpigmentation: Secondary | ICD-10-CM | POA: Diagnosis not present

## 2023-08-03 DIAGNOSIS — L821 Other seborrheic keratosis: Secondary | ICD-10-CM | POA: Diagnosis not present

## 2023-08-03 DIAGNOSIS — Z86006 Personal history of melanoma in-situ: Secondary | ICD-10-CM | POA: Diagnosis not present

## 2023-09-23 DIAGNOSIS — I739 Peripheral vascular disease, unspecified: Secondary | ICD-10-CM | POA: Diagnosis not present

## 2023-09-23 DIAGNOSIS — E559 Vitamin D deficiency, unspecified: Secondary | ICD-10-CM | POA: Diagnosis not present

## 2023-09-23 DIAGNOSIS — I10 Essential (primary) hypertension: Secondary | ICD-10-CM | POA: Diagnosis not present

## 2023-09-23 DIAGNOSIS — E785 Hyperlipidemia, unspecified: Secondary | ICD-10-CM | POA: Diagnosis not present

## 2023-10-01 DIAGNOSIS — I10 Essential (primary) hypertension: Secondary | ICD-10-CM | POA: Diagnosis not present

## 2023-10-01 DIAGNOSIS — E559 Vitamin D deficiency, unspecified: Secondary | ICD-10-CM | POA: Diagnosis not present

## 2023-10-01 DIAGNOSIS — R7303 Prediabetes: Secondary | ICD-10-CM | POA: Diagnosis not present

## 2023-10-01 DIAGNOSIS — I739 Peripheral vascular disease, unspecified: Secondary | ICD-10-CM | POA: Diagnosis not present

## 2023-10-01 DIAGNOSIS — G4700 Insomnia, unspecified: Secondary | ICD-10-CM | POA: Diagnosis not present

## 2023-10-01 DIAGNOSIS — E785 Hyperlipidemia, unspecified: Secondary | ICD-10-CM | POA: Diagnosis not present

## 2024-02-08 DIAGNOSIS — Z86018 Personal history of other benign neoplasm: Secondary | ICD-10-CM | POA: Diagnosis not present

## 2024-02-08 DIAGNOSIS — Z86006 Personal history of melanoma in-situ: Secondary | ICD-10-CM | POA: Diagnosis not present

## 2024-02-08 DIAGNOSIS — L814 Other melanin hyperpigmentation: Secondary | ICD-10-CM | POA: Diagnosis not present

## 2024-02-08 DIAGNOSIS — L821 Other seborrheic keratosis: Secondary | ICD-10-CM | POA: Diagnosis not present

## 2024-02-08 DIAGNOSIS — D225 Melanocytic nevi of trunk: Secondary | ICD-10-CM | POA: Diagnosis not present

## 2024-02-08 DIAGNOSIS — L578 Other skin changes due to chronic exposure to nonionizing radiation: Secondary | ICD-10-CM | POA: Diagnosis not present

## 2024-02-08 DIAGNOSIS — D2271 Melanocytic nevi of right lower limb, including hip: Secondary | ICD-10-CM | POA: Diagnosis not present
